# Patient Record
Sex: Female | Born: 1970 | Race: White | Hispanic: No | Marital: Married | Smoking: Former smoker
Health system: Southern US, Community
[De-identification: ages and names within clinical notes are randomized; demographics above are authoritative.]

## PROBLEM LIST (undated history)

## (undated) DIAGNOSIS — G43909 Migraine, unspecified, not intractable, without status migrainosus: Secondary | ICD-10-CM

## (undated) DIAGNOSIS — I8392 Asymptomatic varicose veins of left lower extremity: Secondary | ICD-10-CM

## (undated) HISTORY — DX: Asymptomatic varicose veins of left lower extremity: I83.92

## (undated) HISTORY — DX: Migraine, unspecified, not intractable, without status migrainosus: G43.909

---

## 1999-01-04 ENCOUNTER — Other Ambulatory Visit: Admission: RE | Admit: 1999-01-04 | Discharge: 1999-01-04 | Payer: Self-pay | Admitting: Gynecology

## 1999-06-26 ENCOUNTER — Other Ambulatory Visit: Admission: RE | Admit: 1999-06-26 | Discharge: 1999-06-26 | Payer: Self-pay | Admitting: Obstetrics and Gynecology

## 2000-01-10 ENCOUNTER — Other Ambulatory Visit: Admission: RE | Admit: 2000-01-10 | Discharge: 2000-01-10 | Payer: Self-pay | Admitting: Gynecology

## 2001-07-02 ENCOUNTER — Ambulatory Visit (HOSPITAL_COMMUNITY): Admission: RE | Admit: 2001-07-02 | Discharge: 2001-07-02 | Payer: Self-pay | Admitting: Gynecology

## 2001-11-21 ENCOUNTER — Ambulatory Visit (HOSPITAL_COMMUNITY): Admission: AD | Admit: 2001-11-21 | Discharge: 2001-11-21 | Payer: Self-pay | Admitting: Gynecology

## 2002-02-03 ENCOUNTER — Other Ambulatory Visit: Admission: RE | Admit: 2002-02-03 | Discharge: 2002-02-03 | Payer: Self-pay | Admitting: Gynecology

## 2002-09-22 ENCOUNTER — Ambulatory Visit (HOSPITAL_COMMUNITY): Admission: RE | Admit: 2002-09-22 | Discharge: 2002-09-22 | Payer: Self-pay | Admitting: Obstetrics and Gynecology

## 2002-12-13 ENCOUNTER — Inpatient Hospital Stay (HOSPITAL_COMMUNITY): Admission: AD | Admit: 2002-12-13 | Discharge: 2002-12-16 | Payer: Self-pay | Admitting: Obstetrics and Gynecology

## 2003-01-25 ENCOUNTER — Other Ambulatory Visit: Admission: RE | Admit: 2003-01-25 | Discharge: 2003-01-25 | Payer: Self-pay | Admitting: Obstetrics and Gynecology

## 2004-01-30 ENCOUNTER — Other Ambulatory Visit: Admission: RE | Admit: 2004-01-30 | Discharge: 2004-01-30 | Payer: Self-pay | Admitting: Gynecology

## 2004-10-11 ENCOUNTER — Other Ambulatory Visit: Admission: RE | Admit: 2004-10-11 | Discharge: 2004-10-11 | Payer: Self-pay | Admitting: Obstetrics and Gynecology

## 2005-02-05 ENCOUNTER — Ambulatory Visit (HOSPITAL_COMMUNITY): Admission: RE | Admit: 2005-02-05 | Discharge: 2005-02-05 | Payer: Self-pay | Admitting: Obstetrics and Gynecology

## 2005-04-23 ENCOUNTER — Inpatient Hospital Stay (HOSPITAL_COMMUNITY): Admission: RE | Admit: 2005-04-23 | Discharge: 2005-04-26 | Payer: Self-pay | Admitting: Obstetrics and Gynecology

## 2005-06-18 ENCOUNTER — Other Ambulatory Visit: Admission: RE | Admit: 2005-06-18 | Discharge: 2005-06-18 | Payer: Self-pay | Admitting: Obstetrics and Gynecology

## 2012-01-06 ENCOUNTER — Other Ambulatory Visit: Payer: Self-pay | Admitting: Obstetrics and Gynecology

## 2013-01-06 ENCOUNTER — Other Ambulatory Visit: Payer: Self-pay | Admitting: Obstetrics and Gynecology

## 2014-01-20 ENCOUNTER — Other Ambulatory Visit: Payer: Self-pay | Admitting: Obstetrics and Gynecology

## 2015-01-23 ENCOUNTER — Other Ambulatory Visit: Payer: Self-pay | Admitting: Obstetrics and Gynecology

## 2015-01-24 LAB — CYTOLOGY - PAP

## 2016-06-14 DIAGNOSIS — Z23 Encounter for immunization: Secondary | ICD-10-CM | POA: Diagnosis not present

## 2016-12-31 DIAGNOSIS — S86811A Strain of other muscle(s) and tendon(s) at lower leg level, right leg, initial encounter: Secondary | ICD-10-CM | POA: Diagnosis not present

## 2017-01-14 DIAGNOSIS — S86811D Strain of other muscle(s) and tendon(s) at lower leg level, right leg, subsequent encounter: Secondary | ICD-10-CM | POA: Diagnosis not present

## 2017-01-20 DIAGNOSIS — R262 Difficulty in walking, not elsewhere classified: Secondary | ICD-10-CM | POA: Diagnosis not present

## 2017-01-20 DIAGNOSIS — S96011D Strain of muscle and tendon of long flexor muscle of toe at ankle and foot level, right foot, subsequent encounter: Secondary | ICD-10-CM | POA: Diagnosis not present

## 2017-04-10 DIAGNOSIS — Z01419 Encounter for gynecological examination (general) (routine) without abnormal findings: Secondary | ICD-10-CM | POA: Diagnosis not present

## 2017-04-10 DIAGNOSIS — Z1212 Encounter for screening for malignant neoplasm of rectum: Secondary | ICD-10-CM | POA: Diagnosis not present

## 2017-04-14 DIAGNOSIS — Z1231 Encounter for screening mammogram for malignant neoplasm of breast: Secondary | ICD-10-CM | POA: Diagnosis not present

## 2017-07-13 DIAGNOSIS — Z23 Encounter for immunization: Secondary | ICD-10-CM | POA: Diagnosis not present

## 2018-04-14 DIAGNOSIS — Z1329 Encounter for screening for other suspected endocrine disorder: Secondary | ICD-10-CM | POA: Diagnosis not present

## 2018-04-14 DIAGNOSIS — Z0000 Encounter for general adult medical examination without abnormal findings: Secondary | ICD-10-CM | POA: Diagnosis not present

## 2018-04-14 DIAGNOSIS — Z1159 Encounter for screening for other viral diseases: Secondary | ICD-10-CM | POA: Diagnosis not present

## 2018-04-14 DIAGNOSIS — Z13220 Encounter for screening for lipoid disorders: Secondary | ICD-10-CM | POA: Diagnosis not present

## 2018-04-14 DIAGNOSIS — Z114 Encounter for screening for human immunodeficiency virus [HIV]: Secondary | ICD-10-CM | POA: Diagnosis not present

## 2018-04-15 DIAGNOSIS — Z6823 Body mass index (BMI) 23.0-23.9, adult: Secondary | ICD-10-CM | POA: Diagnosis not present

## 2018-04-15 DIAGNOSIS — Z1231 Encounter for screening mammogram for malignant neoplasm of breast: Secondary | ICD-10-CM | POA: Diagnosis not present

## 2018-04-15 DIAGNOSIS — Z01419 Encounter for gynecological examination (general) (routine) without abnormal findings: Secondary | ICD-10-CM | POA: Diagnosis not present

## 2018-04-16 ENCOUNTER — Other Ambulatory Visit: Payer: Self-pay | Admitting: Family Medicine

## 2018-04-16 DIAGNOSIS — I8392 Asymptomatic varicose veins of left lower extremity: Secondary | ICD-10-CM | POA: Diagnosis not present

## 2018-04-16 DIAGNOSIS — Z0000 Encounter for general adult medical examination without abnormal findings: Secondary | ICD-10-CM | POA: Diagnosis not present

## 2018-04-16 DIAGNOSIS — Z6823 Body mass index (BMI) 23.0-23.9, adult: Secondary | ICD-10-CM | POA: Diagnosis not present

## 2018-04-16 DIAGNOSIS — M7989 Other specified soft tissue disorders: Secondary | ICD-10-CM | POA: Diagnosis not present

## 2018-04-16 DIAGNOSIS — G43909 Migraine, unspecified, not intractable, without status migrainosus: Secondary | ICD-10-CM | POA: Diagnosis not present

## 2018-04-16 DIAGNOSIS — Z3041 Encounter for surveillance of contraceptive pills: Secondary | ICD-10-CM | POA: Diagnosis not present

## 2018-04-16 DIAGNOSIS — Z23 Encounter for immunization: Secondary | ICD-10-CM | POA: Diagnosis not present

## 2018-04-17 ENCOUNTER — Encounter: Payer: Self-pay | Admitting: Internal Medicine

## 2018-05-18 DIAGNOSIS — G43909 Migraine, unspecified, not intractable, without status migrainosus: Secondary | ICD-10-CM | POA: Diagnosis not present

## 2018-05-18 DIAGNOSIS — I8392 Asymptomatic varicose veins of left lower extremity: Secondary | ICD-10-CM | POA: Diagnosis not present

## 2018-05-18 DIAGNOSIS — Z6822 Body mass index (BMI) 22.0-22.9, adult: Secondary | ICD-10-CM | POA: Diagnosis not present

## 2018-05-18 DIAGNOSIS — Z23 Encounter for immunization: Secondary | ICD-10-CM | POA: Diagnosis not present

## 2018-05-18 DIAGNOSIS — R2242 Localized swelling, mass and lump, left lower limb: Secondary | ICD-10-CM | POA: Diagnosis not present

## 2018-06-16 ENCOUNTER — Ambulatory Visit: Payer: Self-pay | Admitting: Internal Medicine

## 2018-07-23 ENCOUNTER — Encounter: Payer: Self-pay | Admitting: Sports Medicine

## 2018-07-23 ENCOUNTER — Ambulatory Visit: Payer: BLUE CROSS/BLUE SHIELD | Admitting: Sports Medicine

## 2018-07-23 VITALS — BP 126/88 | Ht 68.0 in | Wt 150.0 lb

## 2018-07-23 DIAGNOSIS — M7551 Bursitis of right shoulder: Secondary | ICD-10-CM | POA: Diagnosis not present

## 2018-07-23 MED ORDER — METHYLPREDNISOLONE ACETATE 40 MG/ML IJ SUSP
40.0000 mg | Freq: Once | INTRAMUSCULAR | Status: AC
  Administered 2018-07-23: 40 mg via INTRA_ARTICULAR

## 2018-07-23 NOTE — Progress Notes (Signed)
PCP: Lewis Moccasin, MD  Subjective:   HPI: Patient is a 47 y.o. female here for right lateral shoulder pain.  Patient is an active tennis player who has had shoulder pain for the past 4-6 weeks. No inciting injury or event. She reports gradual worsening of pain- eaching overhead for serve and going back for a backhand are aggravating motions. Recently, pain is worse at night when she lies on her right shoulder. She takes 600 mg ibuprofen before bed. Has not been icing and continues to play tennis 3x weekly but is approaching a time when she can take a break.  She denies any significant weakness, numbness or tingling.  No associated skin changes  Past Medical History:  Diagnosis Date  . Migraines     No current outpatient medications on file prior to visit.   No current facility-administered medications on file prior to visit.     History reviewed. No pertinent surgical history.  No Known Allergies  Social History   Socioeconomic History  . Marital status: Married    Spouse name: Not on file  . Number of children: Not on file  . Years of education: Not on file  . Highest education level: Not on file  Occupational History  . Not on file  Social Needs  . Financial resource strain: Not on file  . Food insecurity:    Worry: Not on file    Inability: Not on file  . Transportation needs:    Medical: Not on file    Non-medical: Not on file  Tobacco Use  . Smoking status: Not on file  Substance and Sexual Activity  . Alcohol use: Not on file  . Drug use: Not on file  . Sexual activity: Not on file  Lifestyle  . Physical activity:    Days per week: Not on file    Minutes per session: Not on file  . Stress: Not on file  Relationships  . Social connections:    Talks on phone: Not on file    Gets together: Not on file    Attends religious service: Not on file    Active member of club or organization: Not on file    Attends meetings of clubs or organizations: Not on  file    Relationship status: Not on file  . Intimate partner violence:    Fear of current or ex partner: Not on file    Emotionally abused: Not on file    Physically abused: Not on file    Forced sexual activity: Not on file  Other Topics Concern  . Not on file  Social History Narrative  . Not on file    History reviewed. No pertinent family history.  BP 126/88   Ht 5\' 8"  (1.727 m)   Wt 150 lb (68 kg)   BMI 22.81 kg/m   Review of Systems: See HPI above.     Objective:  Physical Exam:  Gen: NAD, comfortable in exam room  Shoulder: No obvious deformity or asymmetry. No bruising. No swelling No TTP. Specifically no TTP over AC joint Full ROM in flexion, abduction, internal/external rotation NV intact distally Special Tests:  - Impingement: Positive Hawkins and Neers.  - Supraspinatous: Pain with empty can.  5/5 strength - Infraspinatous/Teres: 5/5 strength with ER - Subscapularis: negative belly press, negative bear hug. 5/5 strength with IR - Biceps tendon: Negative Speeds.  - Labrum: Negative Obriens.  - AC Joint: Negative cross arm - Negative apprehension test  ULTRASOUND: Shoulder, right Diagnostic limited ultrasound imaging obtained of patient's right shoulder.  - No obvious evidence of bony deformity or osteophyte development appreciated.  - Long head of the biceps tendon: No evidence of tendon thickening, calcification, subluxation, or tearing in short or long axis views. No edema  - Subscapularis tendon: complete visualization across the width of the insertion point yielded no evidence of tendon thickening, calcification, or tears in the long axis view.  - Supraspinatus tendon: complete visualization across the width of the insertion point yielded no evidence of tendon thickening, calcification, or tears in the long axis view.  Evidence of bursal inflammation appreciated with hypoechoic changes   - Infraspinatus and teres minor tendons: visualization across  the width of the insertion points yielded n evidence of tendon thickening, calcification, or tears in the long axis view.  [facility] Joint: No evidence of joint separation, collapse, or osteophyte development appreciated. No effusion present.  IMPRESSION: findings consistent with right subacromial bursitis   Assessment & Plan:  1. Right subacromial bursitis- evidence on exam and ultrasound - steroid injection into subacromial space as detailed below - instructed patient to take NSAIDs for pain, ice daily - provided home exercises - activity as tolerated - follow up in 6 weeks   Procedure performed: subacromial corticosteroid injection; palpation guided  Consent obtained and verified. Time-out conducted. Noted no overlying erythema, induration, or other signs of local infection. The Right posterior subacromial space was palpated and marked. The overlying skin was prepped in a sterile fashion. Topical analgesic spray: Ethyl chloride. Joint: right subacromial Needle: 25ga, 1.5" Completed without difficulty. Meds: 40 mg Depo-Medrol, 4 cc lidocaine  Advised to call if fevers/chills, erythema, induration, drainage, or persistent bleeding.   Above note written by Dr. Ezzard Standing.  Pt was independently evaluated/examined by me. Agree with evaluation and plan as above.  Patient will follow-up in 6 weeks - Dalbert Garnet, DO Sports Medicine Fellow  I was available as preceptor for this visit. Reino Bellis, DO

## 2018-07-23 NOTE — Patient Instructions (Signed)
Your shoulder pain is due to subacromial bursitis. Today you received a steroid injection into the subacromial space. You may continue to use ibuprofen 600-800 mg up to 3 times daily as needed for pain or Aleve 2 tablets twice daily as needed for pain. Ice 15 minutes 3-4 times daily Perform the home exercises you were instructed on today once daily about 5 days/week You may continue activity as tolerated.  Let pain be your guide. We will have you follow-up in about 6 weeks or sooner if pain significantly worsens.

## 2018-08-25 ENCOUNTER — Other Ambulatory Visit: Payer: Self-pay

## 2018-08-25 DIAGNOSIS — R609 Edema, unspecified: Secondary | ICD-10-CM

## 2018-09-03 ENCOUNTER — Encounter: Payer: Self-pay | Admitting: Sports Medicine

## 2018-09-03 ENCOUNTER — Ambulatory Visit (INDEPENDENT_AMBULATORY_CARE_PROVIDER_SITE_OTHER): Payer: BLUE CROSS/BLUE SHIELD | Admitting: Sports Medicine

## 2018-09-03 VITALS — BP 128/90 | Ht 68.0 in | Wt 153.0 lb

## 2018-09-03 DIAGNOSIS — M25511 Pain in right shoulder: Secondary | ICD-10-CM

## 2018-09-03 MED ORDER — NITROGLYCERIN 0.2 MG/HR TD PT24: MEDICATED_PATCH | TRANSDERMAL | 1 refills | Status: DC

## 2018-09-03 NOTE — Patient Instructions (Signed)
Nitroglycerin Protocol   Apply 1/4 nitroglycerin patch to affected area daily.  Change position of patch within the affected area every 24 hours.  You may experience a headache during the first 1-2 weeks of using the patch, these should subside.  If you experience headaches after beginning nitroglycerin patch treatment, you may take your preferred over the counter pain reliever.  Another side effect of the nitroglycerin patch is skin irritation or rash related to patch adhesive.  Please notify our office if you develop more severe headaches or rash, and stop the patch.  Tendon healing with nitroglycerin patch may require 12 to 24 weeks depending on the extent of injury.  Men should not use if taking Viagra, Cialis, or Levitra.   Do not use if you have migraines or rosacea.    Follow up in 6 weeks. 

## 2018-09-03 NOTE — Progress Notes (Addendum)
  Samantha FreezeMargaret S Dunn - 47 y.o. female MRN [id number]  Date of birth: 1970-10-10    SUBJECTIVE:      Chief Complaint:/ HPI:   Samantha EagleMargaret Dunn is a 47 year old female who presents to clinic for a follow-up of worsening R shoulder pain.  She was seen in clinic 07/23/18 for the same and given a corticosteroid injection to the R shoulder which did not provide relief.  She has continued to have pain with activities requiring her to lift her R arm, most notably serving and overhead movements while playing tennis.  The pain is constant and she rates it 8/10 in severity at its worst.  She denies radiation of pain down her arm, neck pain, numbness/tingling or weakness.  She has been treating it with Tylenol and Aleve with mild relief.  Samantha Dunn reports she has been performing recommended at-home physical therapy exercises.   ROS:     See HPI  PERTINENT  PMH / PSH FH / / SH:  Past Medical, Surgical, Social, and Family History Reviewed & Updated in the EMR.  Pertinent findings include:   Migraines  OBJECTIVE: BP 128/90   Ht 5\' 8"  (1.727 m)   Wt 153 lb (69.4 kg)   BMI 23.26 kg/m   Physical Exam:  Vital signs are reviewed.  GEN: Alert and oriented, NAD Pulm: Breathing unlabored PSY: normal mood, congruent affect  MSK:  R shoulder Inspection: No swelling or erythema. No rashes. Palpation: No TTP Range of Motion: active ROM limited by pain.  Full ROM with passive movement. Strength: 4+ strength to flexion, internal rotation, limited by pain Stability: Special Tests: Positive Hawkins and Neers.  Pain with empty can and lift off.   Neurovascular Status: Sensation intact.  Bicep and brachioradialis reflexes 2+.    ASSESSMENT & PLAN:  1. Rotator Cuff Tendinopathy Patient's history of pain with overhead movement and physical exam findings support rotator cuff tendinopathy.  Will refer to physical therapy.  Nitro patch prescribed for pain relief and discussed side effects.   Follow-up in 6 weeks.  If not improved, will consider further imaging and additional course of corticosteroid injection.  I was the preceptor for this visit and available for immediate consultation. Marsa Arisimothy Ryan Draper, DO

## 2018-09-07 ENCOUNTER — Encounter: Payer: Self-pay | Admitting: Surgery

## 2018-09-07 ENCOUNTER — Other Ambulatory Visit: Payer: Self-pay

## 2018-09-07 ENCOUNTER — Ambulatory Visit (INDEPENDENT_AMBULATORY_CARE_PROVIDER_SITE_OTHER): Payer: BLUE CROSS/BLUE SHIELD | Admitting: Surgery

## 2018-09-07 ENCOUNTER — Ambulatory Visit (HOSPITAL_COMMUNITY)
Admission: RE | Admit: 2018-09-07 | Discharge: 2018-09-07 | Disposition: A | Discharge status: Home / Self Care | Payer: BLUE CROSS/BLUE SHIELD | Source: Ambulatory Visit | Attending: Family Medicine | Admitting: Family Medicine

## 2018-09-07 VITALS — BP 144/94 | HR 72 | Temp 97.0°F | Resp 16 | Ht 68.0 in | Wt 153.0 lb

## 2018-09-07 DIAGNOSIS — R609 Edema, unspecified: Secondary | ICD-10-CM | POA: Insufficient documentation

## 2018-09-07 DIAGNOSIS — I83893 Varicose veins of bilateral lower extremities with other complications: Secondary | ICD-10-CM | POA: Diagnosis not present

## 2018-09-07 NOTE — Progress Notes (Signed)
[facility]  Patient name: Samantha Dunn MRN: [id number] DOB: [birth year] Sex: female   REQUESTING PROVIDER:    Dr. Duanne Guess   REASON FOR CONSULT:    Varicose veins  HISTORY OF PRESENT ILLNESS:   Samantha Dunn is a 47 y.o. female, who is referred for evaluation of varicose veins.  She states that she has been having swelling in her legs for approximately 10 months.  She has had her varicose veins for longer than that.  Her swelling is worse with prolonged standing.  She was on a birth control pill over the summer which has been discontinued.  She has not worn compression stockings.  She denies a history of DVT or thrombophlebitis.  PAST MEDICAL HISTORY    Past Medical History:  Diagnosis Date  . Migraines      FAMILY HISTORY   History reviewed. No pertinent family history.  SOCIAL HISTORY:   Social History   Socioeconomic History  . Marital status: Married    Spouse name: Not on file  . Number of children: Not on file  . Years of education: Not on file  . Highest education level: Not on file  Occupational History  . Not on file  Social Needs  . Financial resource strain: Not on file  . Food insecurity:    Worry: Not on file    Inability: Not on file  . Transportation needs:    Medical: Not on file    Non-medical: Not on file  Tobacco Use  . Smoking status: Former Smoker    Types: Cigarettes  . Smokeless tobacco: Never Used  Substance and Sexual Activity  . Alcohol use: Yes  . Drug use: Never  . Sexual activity: Not on file  Lifestyle  . Physical activity:    Days per week: Not on file    Minutes per session: Not on file  . Stress: Not on file  Relationships  . Social connections:    Talks on phone: Not on file    Gets together: Not on file    Attends religious service: Not on file    Active member of club or organization: Not on file    Attends meetings of clubs or  organizations: Not on file    Relationship status: Not on file  . Intimate partner violence:    Fear of current or ex partner: Not on file    Emotionally abused: Not on file    Physically abused: Not on file    Forced sexual activity: Not on file  Other Topics Concern  . Not on file  Social History Narrative  . Not on file    ALLERGIES:    No Known Allergies  CURRENT MEDICATIONS:    Current Outpatient Medications  Medication Sig Dispense Refill  . nitroGLYCERIN (NITRODUR - DOSED IN MG/24 HR) 0.2 mg/hr patch Use 1/4 patch daily to the affected area. 30 patch 1   No current facility-administered medications for this visit.     REVIEW OF SYSTEMS:   [X]  denotes positive finding, [ ]  denotes negative finding Cardiac  Comments:  Chest pain or chest pressure:    Shortness of breath upon exertion:    Short of breath when lying flat:    Irregular heart rhythm:        Vascular    Pain in calf, thigh, or hip brought on by ambulation:    Pain in feet at night that wakes you up from your sleep:  Blood clot in your veins:    Leg swelling:  x       Pulmonary    Oxygen at home:    Productive cough:     Wheezing:         Neurologic    Sudden weakness in arms or legs:     Sudden numbness in arms or legs:     Sudden onset of difficulty speaking or slurred speech:    Temporary loss of vision in one eye:     Problems with dizziness:         Gastrointestinal    Blood in stool:      Vomited blood:         Genitourinary    Burning when urinating:     Blood in urine:        Psychiatric    Major depression:         Hematologic    Bleeding problems:    Problems with blood clotting too easily:        Skin    Rashes or ulcers:        Constitutional    Fever or chills:     PHYSICAL EXAM:   Vitals:   09/07/18 1428  BP: (!) 144/94  Pulse: 72  Resp: 16  Temp: (!) 97 F (36.1 C)  TempSrc: Oral  SpO2: 98%  Weight: 153 lb (69.4 kg)  Height: 5\' 8"  (1.727 m)     GENERAL: The patient is a well-nourished female, in no acute distress. The vital signs are documented above. VASCULAR: Palpable pedal pulses bilaterally.  Trace edema bilaterally.  Prominent posterior left calf varicosities PULMONARY: Nonlabored respirations MUSCULOSKELETAL: There are no major deformities or cyanosis. NEUROLOGIC: No focal weakness or paresthesias are detected. SKIN: There are no ulcers or rashes noted. PSYCHIATRIC: The patient has a normal affect.  STUDIES:   I have ordered and reviewed her vascular studies with the following findings: Venous Reflux Times Normal value < 0.5 sec +------------------------------+----------+---------+                Right (ms)Left (ms) +------------------------------+----------+---------+ CFV                   2428.00  +------------------------------+----------+---------+ GSV at Saphenofemoral junction     2230.00  +------------------------------+----------+---------+ GSV prox thigh             579.00   +------------------------------+----------+---------+ SSV origin               4225.00  +------------------------------+----------+---------+ SSV prox                4856.00  +------------------------------+----------+---------+ SSV mid                 4005.00  +------------------------------+----------+---------+  Vein Diameters: +------------------------------+----------+---------+                Right (cm)Left (cm) +------------------------------+----------+---------+ GSV at Saphenofemoral junction     0.649   +------------------------------+----------+---------+ GSV at prox thigh            0.331   +------------------------------+----------+---------+ GSV at mid thigh            0.304    +------------------------------+----------+---------+ GSV at distal thigh           0.330   +------------------------------+----------+---------+ GSV at knee               0.284   +------------------------------+----------+---------+ GSV prox calf  0.280   +------------------------------+----------+---------+ GSV mid calf              0.346   +------------------------------+----------+---------+ SSV origin               0.910   +------------------------------+----------+---------+ SSV prox                0.839   +------------------------------+----------+---------+ SSV mid                 0.702   +------------------------------+----------+---------+     Summary: Left: Abnormal reflux times were noted in the common femoral vein, great saphenous vein at the saphenofemoral junction, great saphenous vein at the proximal thigh, origin of the small saphenous vein, prox small saphenous vein, and mid small saphenous  vein. There is no evidence of deep vein thrombosis in the lower extremity within the CFV, FV, and POPV.There is no evidence of superficial venous thrombosis within the GSV and SSV.  ASSESSMENT and PLAN   Varicose veins with edema, left leg (CEAP class III): We discussed the pathophysiology of venous reflux.  We will treat her initially non-operatively.  This will consist of 20-30 thigh-high compression stockings, leg elevation and ibuprofen for pain control.  Patient will be brought back in 3 months for follow-up evaluation to discuss the possibility of endovenous laser ablation and stab phlebectomy.  She has significant small saphenous reflux and moderate great saphenous reflux.   Durene Cal, MD [facility] [phone] Pager [phone]

## 2018-09-11 DIAGNOSIS — R293 Abnormal posture: Secondary | ICD-10-CM | POA: Diagnosis not present

## 2018-09-11 DIAGNOSIS — M25511 Pain in right shoulder: Secondary | ICD-10-CM | POA: Diagnosis not present

## 2018-09-11 DIAGNOSIS — M629 Disorder of muscle, unspecified: Secondary | ICD-10-CM | POA: Diagnosis not present

## 2018-09-14 DIAGNOSIS — M629 Disorder of muscle, unspecified: Secondary | ICD-10-CM | POA: Diagnosis not present

## 2018-09-14 DIAGNOSIS — R293 Abnormal posture: Secondary | ICD-10-CM | POA: Diagnosis not present

## 2018-09-14 DIAGNOSIS — M25511 Pain in right shoulder: Secondary | ICD-10-CM | POA: Diagnosis not present

## 2018-09-18 DIAGNOSIS — M629 Disorder of muscle, unspecified: Secondary | ICD-10-CM | POA: Diagnosis not present

## 2018-09-18 DIAGNOSIS — M25511 Pain in right shoulder: Secondary | ICD-10-CM | POA: Diagnosis not present

## 2018-09-18 DIAGNOSIS — R293 Abnormal posture: Secondary | ICD-10-CM | POA: Diagnosis not present

## 2018-09-22 DIAGNOSIS — M25511 Pain in right shoulder: Secondary | ICD-10-CM | POA: Diagnosis not present

## 2018-09-22 DIAGNOSIS — M629 Disorder of muscle, unspecified: Secondary | ICD-10-CM | POA: Diagnosis not present

## 2018-09-22 DIAGNOSIS — R293 Abnormal posture: Secondary | ICD-10-CM | POA: Diagnosis not present

## 2018-09-25 DIAGNOSIS — M25511 Pain in right shoulder: Secondary | ICD-10-CM | POA: Diagnosis not present

## 2018-09-25 DIAGNOSIS — R293 Abnormal posture: Secondary | ICD-10-CM | POA: Diagnosis not present

## 2018-09-25 DIAGNOSIS — M629 Disorder of muscle, unspecified: Secondary | ICD-10-CM | POA: Diagnosis not present

## 2018-09-28 ENCOUNTER — Telehealth: Payer: Self-pay | Admitting: Sports Medicine

## 2018-09-28 NOTE — Telephone Encounter (Signed)
Carly from [facility] on [address] inquiring about prescription for 'Iontophoresis'.  Please call and let her know the status of this script for this patient.  [phone])

## 2018-09-28 NOTE — Telephone Encounter (Signed)
Will fax PT order for Iontophoresis to Carlie at [phone]

## 2018-10-02 ENCOUNTER — Other Ambulatory Visit: Payer: Self-pay

## 2018-10-02 DIAGNOSIS — M629 Disorder of muscle, unspecified: Secondary | ICD-10-CM | POA: Diagnosis not present

## 2018-10-02 DIAGNOSIS — R293 Abnormal posture: Secondary | ICD-10-CM | POA: Diagnosis not present

## 2018-10-02 DIAGNOSIS — M25511 Pain in right shoulder: Secondary | ICD-10-CM | POA: Diagnosis not present

## 2018-10-02 MED ORDER — AMBULATORY NON FORMULARY MEDICATION: 0 refills | Status: DC

## 2018-10-06 DIAGNOSIS — R293 Abnormal posture: Secondary | ICD-10-CM | POA: Diagnosis not present

## 2018-10-06 DIAGNOSIS — M629 Disorder of muscle, unspecified: Secondary | ICD-10-CM | POA: Diagnosis not present

## 2018-10-06 DIAGNOSIS — M25511 Pain in right shoulder: Secondary | ICD-10-CM | POA: Diagnosis not present

## 2018-10-09 DIAGNOSIS — M629 Disorder of muscle, unspecified: Secondary | ICD-10-CM | POA: Diagnosis not present

## 2018-10-09 DIAGNOSIS — R293 Abnormal posture: Secondary | ICD-10-CM | POA: Diagnosis not present

## 2018-10-09 DIAGNOSIS — M25511 Pain in right shoulder: Secondary | ICD-10-CM | POA: Diagnosis not present

## 2018-10-13 DIAGNOSIS — M629 Disorder of muscle, unspecified: Secondary | ICD-10-CM | POA: Diagnosis not present

## 2018-10-13 DIAGNOSIS — M25511 Pain in right shoulder: Secondary | ICD-10-CM | POA: Diagnosis not present

## 2018-10-13 DIAGNOSIS — R293 Abnormal posture: Secondary | ICD-10-CM | POA: Diagnosis not present

## 2018-10-15 ENCOUNTER — Ambulatory Visit: Payer: BLUE CROSS/BLUE SHIELD | Admitting: Sports Medicine

## 2018-10-16 DIAGNOSIS — R293 Abnormal posture: Secondary | ICD-10-CM | POA: Diagnosis not present

## 2018-10-16 DIAGNOSIS — M25511 Pain in right shoulder: Secondary | ICD-10-CM | POA: Diagnosis not present

## 2018-10-16 DIAGNOSIS — M629 Disorder of muscle, unspecified: Secondary | ICD-10-CM | POA: Diagnosis not present

## 2018-10-19 DIAGNOSIS — M25511 Pain in right shoulder: Secondary | ICD-10-CM | POA: Diagnosis not present

## 2018-10-19 DIAGNOSIS — R293 Abnormal posture: Secondary | ICD-10-CM | POA: Diagnosis not present

## 2018-10-19 DIAGNOSIS — M629 Disorder of muscle, unspecified: Secondary | ICD-10-CM | POA: Diagnosis not present

## 2018-10-20 DIAGNOSIS — Z23 Encounter for immunization: Secondary | ICD-10-CM | POA: Diagnosis not present

## 2018-10-26 DIAGNOSIS — M629 Disorder of muscle, unspecified: Secondary | ICD-10-CM | POA: Diagnosis not present

## 2018-10-26 DIAGNOSIS — R293 Abnormal posture: Secondary | ICD-10-CM | POA: Diagnosis not present

## 2018-10-26 DIAGNOSIS — M25511 Pain in right shoulder: Secondary | ICD-10-CM | POA: Diagnosis not present

## 2018-11-02 DIAGNOSIS — M629 Disorder of muscle, unspecified: Secondary | ICD-10-CM | POA: Diagnosis not present

## 2018-11-02 DIAGNOSIS — R293 Abnormal posture: Secondary | ICD-10-CM | POA: Diagnosis not present

## 2018-11-02 DIAGNOSIS — M25511 Pain in right shoulder: Secondary | ICD-10-CM | POA: Diagnosis not present

## 2018-12-10 ENCOUNTER — Ambulatory Visit: Payer: BLUE CROSS/BLUE SHIELD | Admitting: Vascular Surgery

## 2018-12-24 ENCOUNTER — Ambulatory Visit: Payer: BLUE CROSS/BLUE SHIELD | Admitting: Vascular Surgery

## 2019-03-15 DIAGNOSIS — H6691 Otitis media, unspecified, right ear: Secondary | ICD-10-CM | POA: Diagnosis not present

## 2019-03-31 ENCOUNTER — Telehealth (HOSPITAL_COMMUNITY): Payer: Self-pay | Admitting: Rehabilitation

## 2019-03-31 DIAGNOSIS — R448 Other symptoms and signs involving general sensations and perceptions: Secondary | ICD-10-CM | POA: Diagnosis not present

## 2019-03-31 DIAGNOSIS — H6691 Otitis media, unspecified, right ear: Secondary | ICD-10-CM | POA: Diagnosis not present

## 2019-03-31 NOTE — Telephone Encounter (Signed)
The above patient or their representative was contacted and gave the following answers to these questions:         Do you have any of the following symptoms? No Fever                    Cough                   Shortness of breath  Do  you have any of the following other symptoms? No  muscle pain         vomiting,        diarrhea        rash         weakness        red eye        abdominal pain         bruising          bruising or bleeding              joint pain           severe headache   Have you been in contact with someone who was or has been sick in the past 2 weeks? No Yes                 Unsure                         Unable to assess   Does the person that you were in contact with have any of the following symptoms?  Cough         shortness of breath           muscle pain         vomiting,            diarrhea            rash            weakness           fever            red eye           abdominal pain          bruising  or  bleeding                joint pain                severe headache             Have you  or someone you have been in contact with traveled internationally in the last month?  No      If yes, which countries?  Have you  or someone you have been in contact with traveled outside [place] in the last month?  Yes     If yes, which state and city? [place], [place]  COMMENTS OR ACTION PLAN FOR THIS PATIENT:

## 2019-04-01 ENCOUNTER — Ambulatory Visit (INDEPENDENT_AMBULATORY_CARE_PROVIDER_SITE_OTHER): Payer: BC Managed Care – PPO | Admitting: Vascular Surgery

## 2019-04-01 ENCOUNTER — Other Ambulatory Visit: Payer: Self-pay

## 2019-04-01 ENCOUNTER — Encounter: Payer: Self-pay | Admitting: Vascular Surgery

## 2019-04-01 VITALS — BP 125/81 | HR 64 | Temp 98.7°F | Resp 16

## 2019-04-01 DIAGNOSIS — Z1159 Encounter for screening for other viral diseases: Secondary | ICD-10-CM | POA: Diagnosis not present

## 2019-04-01 DIAGNOSIS — I83893 Varicose veins of bilateral lower extremities with other complications: Secondary | ICD-10-CM

## 2019-04-01 DIAGNOSIS — R609 Edema, unspecified: Secondary | ICD-10-CM | POA: Diagnosis not present

## 2019-04-01 NOTE — Progress Notes (Signed)
Patient name: Samantha HawMargaret S Orwick MRN: [id number] DOB: 11/14/1970 Sex: female  REASON FOR VISIT:   Follow-up of painful varicose veins  HPI:   Samantha Dunn is a pleasant 48 y.o. female who was seen by Dr. Durene CalWells Brabham on 09/07/2018 with painful varicose veins.  She also complained of leg swelling at the time.  At that time she was noted to have some deep venous reflux and also reflux at the saphenofemoral junction.  Most impressively the small saphenous vein had marketed reflux and was markedly dilated.  This was all on the left leg.   On my history the patient continues to have significant heaviness and aching pain in her left leg associated with standing and relieved with elevation.  She has been wearing her thigh-high compression stockings with some relief but continues to have significant symptoms.  She takes ibuprofen as needed for pain.  Her symptoms have not improved.  Past Medical History:  Diagnosis Date  . Migraines   . Varicose veins of left lower extremity     No family history on file.  SOCIAL HISTORY: Social History   Tobacco Use  . Smoking status: Former Smoker    Types: Cigarettes  . Smokeless tobacco: Never Used  Substance Use Topics  . Alcohol use: Yes    No Known Allergies  Current Outpatient Medications  Medication Sig Dispense Refill  . AMBULATORY NON FORMULARY MEDICATION Dexamethasone Liquid Solution 0.4% (Patient not taking: Reported on 04/01/2019) 2 oz 0  . amoxicillin-clavulanate (AUGMENTIN) 875-125 MG tablet TAKE 1 TABLET BY MOUTH EVERY 12 HOURS FOR 10 DAYS    . nitroGLYCERIN (NITRODUR - DOSED IN MG/24 HR) 0.2 mg/hr patch Use 1/4 patch daily to the affected area. (Patient not taking: Reported on 04/01/2019) 30 patch 1   No current facility-administered medications for this visit.     REVIEW OF SYSTEMS:  [X]  denotes positive finding, [ ]  denotes negative finding Cardiac  Comments:  Chest pain or chest pressure:    Shortness of breath  upon exertion:    Short of breath when lying flat:    Irregular heart rhythm:        Vascular    Pain in calf, thigh, or hip brought on by ambulation:    Pain in feet at night that wakes you up from your sleep:     Blood clot in your veins:    Leg swelling:         Pulmonary    Oxygen at home:    Productive cough:     Wheezing:         Neurologic    Sudden weakness in arms or legs:     Sudden numbness in arms or legs:     Sudden onset of difficulty speaking or slurred speech:    Temporary loss of vision in one eye:     Problems with dizziness:         Gastrointestinal    Blood in stool:     Vomited blood:         Genitourinary    Burning when urinating:     Blood in urine:        Psychiatric    Major depression:         Hematologic    Bleeding problems:    Problems with blood clotting too easily:        Skin    Rashes or ulcers:        Constitutional  Fever or chills:     PHYSICAL EXAM:   Vitals:   04/01/19 1315  BP: 125/81  Pulse: 64  Resp: 16  Temp: 98.7 F (37.1 C)  TempSrc: Temporal  SpO2: 99%    GENERAL: The patient is a well-nourished female, in no acute distress. The vital signs are documented above. CARDIAC: There is a regular rate and rhythm.  VASCULAR: I do not detect carotid bruits.   Both feet are warm and well-perfused. This patient has some varicose veins along the medial left calf and posterior left calf that are under significantly elevated pressure. I did look at the small saphenous vein with the SonoSite and there are significant reflux throughout and the vein is markedly dilated.  We would cannulate the vein and then mid to distal calf. There is no hyperpigmentation.  There is moderate left leg swelling. PULMONARY: There is good air exchange bilaterally without wheezing or rales. ABDOMEN: Soft and non-tender with normal pitched bowel sounds.  MUSCULOSKELETAL: There are no major deformities or cyanosis. NEUROLOGIC: No focal  weakness or paresthesias are detected. SKIN: There are no ulcers or rashes noted. PSYCHIATRIC: The patient has a normal affect.  DATA:    VENOUS DUPLEX: I did review the previous venous duplex scan which showed significant reflux in the small saphenous vein throughout its entire length.  The vein was markedly dilated.  Great saphenous vein had some incompetence in the proximal thigh and at the saphenofemoral junction.  There is deep venous reflux in the common femoral vein.  There is no evidence of DVT.  MEDICAL ISSUES:   PAINFUL VARICOSE VEINS LEFT LOWER EXTREMITY: This patient has painful varicose veins of the left lower extremity and leg swelling.  She has CEAP C3 venous disease.  She is failed conservative treatment.  For this reason I think she would be a good candidate for endovenous laser ablation of the small saphenous vein with 10-20 stab phlebectomies.  I have discussed the indications for endovenous laser ablation of the left SSV, that is to lower the pressure in the veins and potentially help relieve the symptoms from venous hypertension. I have also discussed alternative options including conservative treatment with leg elevation, compression therapy, exercise, avoiding prolonged sitting and standing, and weight management. I have discussed the potential complications of the procedure, including, but not limited to: bleeding, bruising, leg swelling, nerve injury, skin burns, significant pain from phlebitis, deep venous thrombosis, or failure of the vein to close.  I have also explained that venous insufficiency is a chronic disease, and that the patient is at risk for recurrent varicose veins in the future.  All of the patient's questions were encouraged and answered. They are agreeable to proceed.   I have discussed with the patient the indications for stab phlebectomy.  I have explained to the patient that that will have small scars from the stab incisions.  I explained that the other  risks include leg swelling, bruising, bleeding, and phlebitis.  All the patient's questions were encouraged and answered and they are agreeable to proceed.  A total of 60minutes was spent on this visit. 15 minutes was face to face time. More than 50% of the time was spent on counseling and coordinating with the patient.    Deitra Mayo [facility] [phone]

## 2019-04-06 DIAGNOSIS — H6121 Impacted cerumen, right ear: Secondary | ICD-10-CM | POA: Diagnosis not present

## 2019-04-06 DIAGNOSIS — H6091 Unspecified otitis externa, right ear: Secondary | ICD-10-CM | POA: Diagnosis not present

## 2019-04-20 ENCOUNTER — Other Ambulatory Visit: Payer: Self-pay | Admitting: *Deleted

## 2019-04-20 DIAGNOSIS — I83812 Varicose veins of left lower extremities with pain: Secondary | ICD-10-CM

## 2019-05-05 DIAGNOSIS — H60311 Diffuse otitis externa, right ear: Secondary | ICD-10-CM | POA: Diagnosis not present

## 2019-05-11 DIAGNOSIS — H6123 Impacted cerumen, bilateral: Secondary | ICD-10-CM | POA: Diagnosis not present

## 2019-05-27 DIAGNOSIS — Z01419 Encounter for gynecological examination (general) (routine) without abnormal findings: Secondary | ICD-10-CM | POA: Diagnosis not present

## 2019-05-27 DIAGNOSIS — Z1231 Encounter for screening mammogram for malignant neoplasm of breast: Secondary | ICD-10-CM | POA: Diagnosis not present

## 2019-05-27 DIAGNOSIS — R87610 Atypical squamous cells of undetermined significance on cytologic smear of cervix (ASC-US): Secondary | ICD-10-CM | POA: Diagnosis not present

## 2019-05-27 DIAGNOSIS — Z6823 Body mass index (BMI) 23.0-23.9, adult: Secondary | ICD-10-CM | POA: Diagnosis not present

## 2019-06-17 ENCOUNTER — Encounter: Payer: Self-pay | Admitting: Vascular Surgery

## 2019-06-17 ENCOUNTER — Ambulatory Visit: Payer: BC Managed Care – PPO | Admitting: Vascular Surgery

## 2019-06-17 ENCOUNTER — Other Ambulatory Visit: Payer: Self-pay

## 2019-06-17 VITALS — BP 123/85 | HR 75 | Temp 97.3°F | Resp 16 | Ht 68.0 in | Wt 150.0 lb

## 2019-06-17 DIAGNOSIS — I83812 Varicose veins of left lower extremities with pain: Secondary | ICD-10-CM

## 2019-06-17 DIAGNOSIS — I83813 Varicose veins of bilateral lower extremities with pain: Secondary | ICD-10-CM

## 2019-06-17 HISTORY — PX: ENDOVENOUS ABLATION SAPHENOUS VEIN W/ LASER: SUR449

## 2019-06-17 NOTE — Progress Notes (Signed)
     Laser Ablation Procedure    Date: 06/17/2019   Samantha Dunn DOB:Apr 03, 1971  Consent signed: Yes    Surgeon: Gae Gallop MD   Procedure: Laser Ablation: left Small Saphenous Vein  BP 123/85 (BP Location: Left Arm, Patient Position: Sitting, Cuff Size: Normal)   Pulse 75   Temp (!) 97.3 F (36.3 C) (Temporal)   Resp 16   Ht 5\' 8"  (1.727 m)   Wt 150 lb (68 kg)   SpO2 100%   BMI 22.81 kg/m   Tumescent Anesthesia: 350 cc 0.9% NaCl with 50 cc Lidocaine HCL 1%  and 15 cc 8.4% NaHCO3  Local Anesthesia: 6 cc Lidocaine HCL and NaHCO3 (ratio 2:1)  15 watts continuous mode        Total energy: 1701 Joules    Total time: 1:53  Laser Fiber Ref. # [id number]      Lot # [id number]   Stab Phlebectomy: 10-20 Sites: Calf and Ankle  Patient tolerated procedure well  Notes: Patient wore face mask.  All staff members wore facial masks and facial shields/goggles.    Description of Procedure:  After marking the course of the secondary varicosities, the patient was placed on the operating table in the prone position, and the left leg was prepped and draped in sterile fashion.   Local anesthetic was administered and under ultrasound guidance the saphenous vein was accessed with a micro needle and guide wire; then the mirco puncture sheath was placed.  A guide wire was inserted saphenopopliteal junction , followed by a 5 french sheath.  The position of the sheath and then the laser fiber below the junction was confirmed using the ultrasound.  Tumescent anesthesia was administered along the course of the saphenous vein using ultrasound guidance. The patient was placed in Trendelenburg position and protective laser glasses were placed on patient and staff, and the laser was fired at 15 watts continuous mode advancing 1-67mm/second for a total of 1701 joules.   For stab phlebectomies, local anesthetic was administered at the previously marked varicosities, and tumescent anesthesia was  administered around the vessels.  Ten to 20 stab wounds were made using the tip of an 11 blade. And using the vein hook, the phlebectomies were performed using a hemostat to avulse the varicosities.  Adequate hemostasis was achieved.     Steri strips were applied to the stab wounds and ABD pads and thigh high compression stockings were applied.  Ace wrap bandages were applied over the phlebectomy sites and at the top of the saphenopopliteal junction. Blood loss was less than 15 cc.  Discharge instructions reviewed with patient and hardcopy of discharge instructions given to patient to take home. The patient ambulated out of the operating room having tolerated the procedure well.

## 2019-06-17 NOTE — Progress Notes (Signed)
   Patient name: DAWNA JAKES MRN: [id number] DOB: March 18, 1971 Sex: female  REASON FOR VISIT:   For laser ablation left short saphenous vein and 10-20 stabs  HPI:   ELLYSSA ZAGAL is a pleasant 48 y.o. 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  She failed conservative treatment and presents for laser ablation left short saphenous vein and 10-20 stabs.  Current Outpatient Medications  Medication Sig Dispense Refill  . AMBULATORY NON FORMULARY MEDICATION Dexamethasone Liquid Solution 0.4% (Patient not taking: Reported on 04/01/2019) 2 oz 0  . amoxicillin-clavulanate (AUGMENTIN) 875-125 MG tablet TAKE 1 TABLET BY MOUTH EVERY 12 HOURS FOR 10 DAYS    . nitroGLYCERIN (NITRODUR - DOSED IN MG/24 HR) 0.2 mg/hr patch Use 1/4 patch daily to the affected area. (Patient not taking: Reported on 04/01/2019) 30 patch 1   No current facility-administered medications for this visit.     REVIEW OF SYSTEMS:  [X]  denotes positive finding, [ ]  denotes negative finding Vascular    Leg swelling    Cardiac    Chest pain or chest pressure:    Shortness of breath upon exertion:    Short of breath when lying flat:    Irregular heart rhythm:    Constitutional    Fever or chills:     PHYSICAL EXAM:   Vitals:   06/17/19 0828  BP: 123/85  Pulse: 75  Resp: 16  Temp: (!) 97.3 F (36.3 C)  TempSrc: Temporal  SpO2: 100%  Weight: 150 lb (68 kg)  Height: 5\' 8"  (1.727 m)    GENERAL: The patient is a well-nourished female  DATA:   No new data  MEDICAL ISSUES:   LASER ABLATION LEFT SHORT SAPHENOUS VEIN/10-20 STABS: The patient was taken to the exam room and the dilated varicose veins were marked with the patient standing.  The patient was then placed prone.  I examined the small saphenous vein with the SonoSite and it looks like he could be cannulated in the mid to distal calf.  The left leg was prepped and draped in usual sterile fashion.  Under  ultrasound guidance, after the skin was anesthetized, I cannulated the left short saphenous vein in the mid to distal calf.  A micropuncture sheath was introduced over wire.  I then advanced the J-wire to the proximal aspect of the short saphenous vein well away from the popliteal vein.  The sheath was then advanced over the wire and the wire and dilator removed.  The laser fiber was positioned at the end of the sheath.  We were approximately 3-1/2 cm from the popliteal vein.  Tumescent anesthesia was then administered the entire length of the vein circumferentially around the vein.  Patient was then placed in Trendelenburg.  Laser ablation was performed of the small saphenous vein.  This was a fairly long vein and a total of 1700 J of energy were used.  Next attention was turned to the stab phlebectomies.  Small stab incisions were made over the marked areas and then using the hook the vein was retracted into the wound and bluntly excised.  Approximately 15 stabs were made.  Pressure was held for hemostasis.  Sterile dressing was applied.  The patient will return in 1 week for follow-up duplex.  Instructions have been reviewed with the patient.  Deitra Mayo [facility] [phone]

## 2019-06-24 ENCOUNTER — Encounter: Payer: Self-pay | Admitting: Vascular Surgery

## 2019-06-24 ENCOUNTER — Other Ambulatory Visit: Payer: Self-pay

## 2019-06-24 ENCOUNTER — Ambulatory Visit (INDEPENDENT_AMBULATORY_CARE_PROVIDER_SITE_OTHER): Payer: BC Managed Care – PPO | Admitting: Vascular Surgery

## 2019-06-24 ENCOUNTER — Ambulatory Visit (HOSPITAL_COMMUNITY)
Admission: RE | Admit: 2019-06-24 | Discharge: 2019-06-24 | Disposition: A | Discharge status: Home / Self Care | Payer: BC Managed Care – PPO | Source: Ambulatory Visit | Attending: Vascular Surgery | Admitting: Vascular Surgery

## 2019-06-24 VITALS — BP 122/83 | HR 64 | Temp 97.8°F | Resp 14

## 2019-06-24 DIAGNOSIS — Z48812 Encounter for surgical aftercare following surgery on the circulatory system: Secondary | ICD-10-CM | POA: Diagnosis not present

## 2019-06-24 DIAGNOSIS — I83812 Varicose veins of left lower extremities with pain: Secondary | ICD-10-CM | POA: Diagnosis not present

## 2019-06-24 MED ORDER — RIVAROXABAN (XARELTO) VTE STARTER PACK (15 & 20 MG): ORAL_TABLET | ORAL | 0 refills | Status: AC

## 2019-06-24 NOTE — Progress Notes (Signed)
   Patient name: Samantha Dunn MRN: [id number] DOB: [birth year] Sex: female  REASON FOR VISIT:   Follow-up after laser ablation of the left short saphenous vein and 10-20 stabs  HPI:   Samantha Dunn is a pleasant 48 y.o. 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  On 06/17/2019 she underwent laser ablation of the left short saphenous vein and 10-20 stabs.  Of note we positioned the fiber 3-1/2 cm from the popliteal vein.  Today the patient has no specific complaints.  She has had no significant left leg swelling with minimal bruising.  Current Outpatient Medications  Medication Sig Dispense Refill  . AMBULATORY NON FORMULARY MEDICATION Dexamethasone Liquid Solution 0.4% (Patient not taking: Reported on 04/01/2019) 2 oz 0  . amoxicillin-clavulanate (AUGMENTIN) 875-125 MG tablet TAKE 1 TABLET BY MOUTH EVERY 12 HOURS FOR 10 DAYS    . nitroGLYCERIN (NITRODUR - DOSED IN MG/24 HR) 0.2 mg/hr patch Use 1/4 patch daily to the affected area. (Patient not taking: Reported on 04/01/2019) 30 patch 1  . Rivaroxaban 15 & 20 MG TBPK Follow package directions: Take one 15mg  tablet by mouth twice a day. On day 22, switch to one 20mg  tablet once a day. Take with food. 51 each 0   No current facility-administered medications for this visit.     REVIEW OF SYSTEMS:  [X]  denotes positive finding, [ ]  denotes negative finding Vascular    Leg swelling    Cardiac    Chest pain or chest pressure:    Shortness of breath upon exertion:    Short of breath when lying flat:    Irregular heart rhythm:    Constitutional    Fever or chills:     PHYSICAL EXAM:   Vitals:   06/24/19 1023  BP: 122/83  Pulse: 64  Resp: 14  Temp: 97.8 F (36.6 C)  TempSrc: Temporal  SpO2: 100%    GENERAL: The patient is a well-nourished female, in no acute distress. The vital signs are documented above. CARDIOVASCULAR: There is a regular rate  and rhythm. PULMONARY: There is good air exchange bilaterally without wheezing or rales. She has no significant left leg swelling. Her stab incisions are healing nicely. She has mild bruising.  DATA:   VENOUS DUPLEX: I have independently interpreted her venous duplex scan today.  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  I looked myself with the SonoSite and was able to compress the popliteal vein above and below this saphenous popliteal junction.  However there was some images which suggested possible extension of the clot into the popliteal vein.  MEDICAL ISSUES:   STATUS POST LASER ABLATION LEFT SMALL SAPHENOUS VEIN AND 10-20 STABS: The patient is doing well status post laser ablation and stab phlebectomies.  However duplex scan suggest possible extension into the popliteal vein despite the fact that we began ablation 3-1/2 cm to the saphenous popliteal junction.  Imaging was somewhat challenging however I feel that the safest course is to put her on Xarelto and do a follow-up scan in 3 weeks which I have ordered.  I will see her back at that time.  She knows to call sooner if she has problems.  I have encouraged her to continue to elevate her legs as much as possible, continue walking, and continue wearing her compression stockings.  Deitra Mayo [facility] [phone]

## 2019-07-09 DIAGNOSIS — Z0000 Encounter for general adult medical examination without abnormal findings: Secondary | ICD-10-CM | POA: Diagnosis not present

## 2019-07-09 DIAGNOSIS — Z1159 Encounter for screening for other viral diseases: Secondary | ICD-10-CM | POA: Diagnosis not present

## 2019-07-13 DIAGNOSIS — H6121 Impacted cerumen, right ear: Secondary | ICD-10-CM | POA: Diagnosis not present

## 2019-07-13 DIAGNOSIS — Z23 Encounter for immunization: Secondary | ICD-10-CM | POA: Diagnosis not present

## 2019-07-13 DIAGNOSIS — H6691 Otitis media, unspecified, right ear: Secondary | ICD-10-CM | POA: Diagnosis not present

## 2019-07-13 DIAGNOSIS — Z0000 Encounter for general adult medical examination without abnormal findings: Secondary | ICD-10-CM | POA: Diagnosis not present

## 2019-07-13 DIAGNOSIS — Z6823 Body mass index (BMI) 23.0-23.9, adult: Secondary | ICD-10-CM | POA: Diagnosis not present

## 2019-07-15 DIAGNOSIS — H6123 Impacted cerumen, bilateral: Secondary | ICD-10-CM | POA: Diagnosis not present

## 2019-07-20 ENCOUNTER — Other Ambulatory Visit: Payer: Self-pay

## 2019-07-20 DIAGNOSIS — R609 Edema, unspecified: Secondary | ICD-10-CM

## 2019-07-22 ENCOUNTER — Encounter: Payer: Self-pay | Admitting: Vascular Surgery

## 2019-07-22 ENCOUNTER — Other Ambulatory Visit: Payer: Self-pay

## 2019-07-22 ENCOUNTER — Ambulatory Visit (INDEPENDENT_AMBULATORY_CARE_PROVIDER_SITE_OTHER): Payer: Self-pay | Admitting: Vascular Surgery

## 2019-07-22 ENCOUNTER — Ambulatory Visit (HOSPITAL_COMMUNITY)
Admission: RE | Admit: 2019-07-22 | Discharge: 2019-07-22 | Disposition: A | Discharge status: Home / Self Care | Payer: BC Managed Care – PPO | Source: Ambulatory Visit | Attending: Family | Admitting: Family

## 2019-07-22 VITALS — BP 112/78 | HR 66 | Temp 97.6°F | Resp 16 | Ht 68.0 in | Wt 149.1 lb

## 2019-07-22 DIAGNOSIS — I83812 Varicose veins of left lower extremities with pain: Secondary | ICD-10-CM

## 2019-07-22 DIAGNOSIS — Z48812 Encounter for surgical aftercare following surgery on the circulatory system: Secondary | ICD-10-CM

## 2019-07-22 DIAGNOSIS — R609 Edema, unspecified: Secondary | ICD-10-CM | POA: Insufficient documentation

## 2019-07-22 NOTE — Progress Notes (Signed)
   Patient name: Samantha Dunn MRN: [id number] DOB: 1971-02-24 Sex: female  REASON FOR VISIT:   Follow-up after laser ablation of the left small saphenous vein with 10-20 stabs  HPI:   Samantha Dunn is a pleasant 48 y.o. female who underwent laser ablation of the left short saphenous vein with 10-20 stabs on 06/17/2019.  I positioned the laser 3-1/2 cm distal to the popliteal vein.  She was seen in follow-up on 06/24/2019.  Her follow-up duplex scan at that time showed possible extension of the clot into the popliteal vein consistent with an EHIT-3, visualization was limited however I felt the safest approach was to put her on Xarelto and do a follow-up scan in 3 weeks.  She comes in today for that follow-up study.  Since I saw her last, she has no specific complaints.  She has been walking and gradually resuming her normal activities.  She is had no significant left leg swelling.  Current Outpatient Medications  Medication Sig Dispense Refill  . AMBULATORY NON FORMULARY MEDICATION Dexamethasone Liquid Solution 0.4% 2 oz 0  . amoxicillin-clavulanate (AUGMENTIN) 875-125 MG tablet TAKE 1 TABLET BY MOUTH EVERY 12 HOURS FOR 10 DAYS    . nitroGLYCERIN (NITRODUR - DOSED IN MG/24 HR) 0.2 mg/hr patch Use 1/4 patch daily to the affected area. 30 patch 1  . Rivaroxaban 15 & 20 MG TBPK Follow package directions: Take one 15mg  tablet by mouth twice a day. On day 22, switch to one 20mg  tablet once a day. Take with food. 51 each 0   No current facility-administered medications for this visit.     REVIEW OF SYSTEMS:  [X]  denotes positive finding, [ ]  denotes negative finding Vascular    Leg swelling    Cardiac    Chest pain or chest pressure:    Shortness of breath upon exertion:    Short of breath when lying flat:    Irregular heart rhythm:    Constitutional    Fever or chills:     PHYSICAL EXAM:   Vitals:   07/22/19 0950  BP: 112/78  Pulse: 66  Resp: 16  Temp: 97.6 F (36.4  C)  TempSrc: Temporal  SpO2: 100%  Weight: 149 lb 1.6 oz (67.6 kg)  Height: 5\' 8"  (1.727 m)    GENERAL: The patient is a well-nourished female, in no acute distress. The vital signs are documented above. She has no significant left leg swelling. Her small stab incisions are all healing nicely.  DATA:   VENOUS DUPLEX: I benefit interpreted her venous duplex scan today.  There is no evidence of DVT on the left.  The clot is successfully closed from 1.73 cm distal to the saphenous popliteal junction to the mid calf.  MEDICAL ISSUES:   STATUS POST LASER ABLATION LEFT SMALL SAPHENOUS VEIN WITH 10-20 STABS: The patient is doing very well status post laser ablation of her left small saphenous vein with 10-20 stabs.  The finding seen on her initial follow-up duplex has resolved.  For this reason I think we can stop her Xarelto.  Overall pleased with her progress and I will see her back as needed.  She has no further procedures planned.  Deitra Mayo [facility] [place] [phone]

## 2019-08-31 ENCOUNTER — Encounter: Payer: Self-pay | Admitting: Vascular Surgery

## 2019-12-08 DIAGNOSIS — H6123 Impacted cerumen, bilateral: Secondary | ICD-10-CM | POA: Diagnosis not present

## 2019-12-08 DIAGNOSIS — H900 Conductive hearing loss, bilateral: Secondary | ICD-10-CM | POA: Diagnosis not present

## 2019-12-11 ENCOUNTER — Ambulatory Visit: Payer: BC Managed Care – PPO | Attending: Internal Medicine

## 2019-12-11 DIAGNOSIS — Z23 Encounter for immunization: Secondary | ICD-10-CM

## 2019-12-11 NOTE — Progress Notes (Signed)
   Covid-19 Vaccination Clinic  Name:  Samantha Dunn    MRN: [id number] DOB: [birth year]  12/11/2019  [name] was observed post Covid-19 immunization for 15 minutes without incident. She was provided with Vaccine Information Sheet and instruction to access the V-Safe system.   [name] was instructed to call 911 with any severe reactions post vaccine: Marland Kitchen Difficulty breathing  . Swelling of face and throat  . A fast heartbeat  . A bad rash all over body  . Dizziness and weakness   Immunizations Administered    Name Date Dose VIS Date Route   Pfizer COVID-19 Vaccine 12/11/2019  3:45 PM 0.3 mL 09/10/2019 Intramuscular   Manufacturer: [employer], [employer]   Lot: [id number]   NDC: [address]-9

## 2020-01-05 ENCOUNTER — Ambulatory Visit: Payer: BC Managed Care – PPO | Attending: Internal Medicine

## 2020-01-05 DIAGNOSIS — Z23 Encounter for immunization: Secondary | ICD-10-CM

## 2020-01-05 NOTE — Progress Notes (Signed)
   Covid-19 Vaccination Clinic  Name:  Samantha Dunn    MRN: [id number] DOB: 06-20-1971  01/05/2020  [name] was observed post Covid-19 immunization for 15 minutes without incident. She was provided with Vaccine Information Sheet and instruction to access the V-Safe system.   [name] was instructed to call 911 with any severe reactions post vaccine: Marland Kitchen Difficulty breathing  . Swelling of face and throat  . A fast heartbeat  . A bad rash all over body  . Dizziness and weakness   Immunizations Administered    Name Date Dose VIS Date Route   Pfizer COVID-19 Vaccine 01/05/2020  4:24 PM 0.3 mL 09/10/2019 Intramuscular   Manufacturer: [employer], [employer]   Lot: [id number]   NDC: [address]-5

## 2020-06-12 DIAGNOSIS — H6121 Impacted cerumen, right ear: Secondary | ICD-10-CM | POA: Diagnosis not present

## 2020-06-14 ENCOUNTER — Other Ambulatory Visit: Payer: BC Managed Care – PPO

## 2020-06-14 ENCOUNTER — Other Ambulatory Visit: Payer: Self-pay

## 2020-06-14 DIAGNOSIS — Z20822 Contact with and (suspected) exposure to covid-19: Secondary | ICD-10-CM | POA: Diagnosis not present

## 2020-06-17 LAB — NOVEL CORONAVIRUS, NAA: SARS-CoV-2, NAA: NOT DETECTED

## 2020-07-14 DIAGNOSIS — Z1159 Encounter for screening for other viral diseases: Secondary | ICD-10-CM | POA: Diagnosis not present

## 2020-07-14 DIAGNOSIS — Z0000 Encounter for general adult medical examination without abnormal findings: Secondary | ICD-10-CM | POA: Diagnosis not present

## 2020-07-18 DIAGNOSIS — Z0000 Encounter for general adult medical examination without abnormal findings: Secondary | ICD-10-CM | POA: Diagnosis not present

## 2020-07-18 DIAGNOSIS — Z23 Encounter for immunization: Secondary | ICD-10-CM | POA: Diagnosis not present

## 2020-07-25 DIAGNOSIS — Z1231 Encounter for screening mammogram for malignant neoplasm of breast: Secondary | ICD-10-CM | POA: Diagnosis not present

## 2020-07-25 DIAGNOSIS — Z01419 Encounter for gynecological examination (general) (routine) without abnormal findings: Secondary | ICD-10-CM | POA: Diagnosis not present

## 2020-07-25 DIAGNOSIS — Z6823 Body mass index (BMI) 23.0-23.9, adult: Secondary | ICD-10-CM | POA: Diagnosis not present

## 2020-08-11 ENCOUNTER — Encounter: Payer: Self-pay | Admitting: Gastroenterology

## 2020-09-18 ENCOUNTER — Telehealth: Payer: Self-pay | Admitting: *Deleted

## 2020-09-18 NOTE — Telephone Encounter (Signed)
Pt has Xarelto listed on her medication list  Attempted to call pt to see if she is on Xarelto- no answer- LM to call back about Xarelto

## 2020-09-19 NOTE — Telephone Encounter (Signed)
Pt has canceled her Colon as states insurance will not cover  Placed an appointment note that she will need an Ov if she RS FIRST due to her Blood thinner Xarelto   Hilda Lias PV

## 2020-10-11 ENCOUNTER — Encounter: Payer: BC Managed Care – PPO | Admitting: Gastroenterology

## 2021-05-17 DIAGNOSIS — H6121 Impacted cerumen, right ear: Secondary | ICD-10-CM | POA: Diagnosis not present

## 2021-06-14 ENCOUNTER — Ambulatory Visit: Payer: BC Managed Care – PPO | Admitting: Sports Medicine

## 2021-06-14 ENCOUNTER — Other Ambulatory Visit: Payer: Self-pay

## 2021-06-14 VITALS — Ht 68.0 in | Wt 150.0 lb

## 2021-06-14 DIAGNOSIS — M25561 Pain in right knee: Secondary | ICD-10-CM | POA: Diagnosis not present

## 2021-06-14 MED ORDER — MELOXICAM 15 MG PO TABS: 15.0000 mg | ORAL_TABLET | Freq: Every day | ORAL | 0 refills | Status: DC

## 2021-06-14 NOTE — Patient Instructions (Addendum)
It was great to meet you today!  For your knee we discussed the following: -Please go get x-rays when you leave here today -Start doing the exercises we gave you, be sure to avoid any deep bending exercises in your workouts as well -Wear the patellar tendon strap with activity -Follow up with me [place] in 3 weeks  Call us with any questions in the meantime!

## 2021-06-14 NOTE — Assessment & Plan Note (Signed)
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  Will obtain XR for baseline, will advise of results.  Given quad strengthening HEP.  Also given Rx for mobic to trial, per notes she is no longer on Xarelto, was short term for vascular procedure in 2020.  If no improvement, could consider formal PT, CSI.  F/U prn.

## 2021-06-14 NOTE — Progress Notes (Signed)
   Samantha Dunn is a 50 y.o. 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  ROS as above. Medications reviewed.  Exam:  Ht 5\' 8"  (1.727 m)   Wt 150 lb (68 kg)   BMI 22.81 kg/m  Gen: Well NAD MSK:  Right Knee: - Inspection: no gross deformity b/l. Mild effusion of right knee, erythema or bruising b/l. Skin intact - Palpation: no TTP b/l - ROM: full active ROM with flexion and extension in knee and hip b/l, she has 2+ crepitus in right, left 1+ crepitus - Strength: 5/5 strength b/l aside from 4/5 strength hip abductors - Neuro/vasc: NV intact distally b/l - Special Tests: - LIGAMENTS: negative anterior and posterior drawer, negative Lachman's, no MCL or LCL laxity  -- MENISCUS: equivocal McMurray's, equivocal Thessaly  -- PF JOINT: tight retinaculum on right.  positive patellar grind, negative patellar apprehension  Hips: normal ROM, negative FABER and FADIR bilaterally   MSK ultrasound right knee: Images were obtained both in the transverse and longitudinal plane. Patellar and quadriceps tendons were well visualized with no abnormalities. Mild effusion. Medial and lateral menisci were well visualized with no abnormalities. Trochlear groove with significant osteophytes noted   No results found.   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  Will obtain XR for baseline, will advise of results.  Given quad strengthening HEP.  Also given Rx for mobic to trial, per notes she is no  longer on Xarelto, was short term for vascular procedure in 2020.  If no improvement, could consider formal PT, CSI.  F/U prn.  2021, D.O.  PGY-4 [facility]  06/14/2021 4:35 PM

## 2021-06-15 ENCOUNTER — Ambulatory Visit
Admission: RE | Admit: 2021-06-15 | Discharge: 2021-06-15 | Disposition: A | Discharge status: Home / Self Care | Payer: BC Managed Care – PPO | Source: Ambulatory Visit | Attending: Sports Medicine | Admitting: Sports Medicine

## 2021-06-15 DIAGNOSIS — M25561 Pain in right knee: Secondary | ICD-10-CM

## 2021-06-21 ENCOUNTER — Telehealth: Payer: Self-pay | Admitting: Sports Medicine

## 2021-06-21 NOTE — Telephone Encounter (Signed)
  I spoke with Samantha Dunn on the phone today after reviewing x-rays of her right knee.  X-rays do not show any significant arthritic changes.  She does have a small joint effusion.  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  She is doing well with her home exercises.  She has a follow-up appointment with me on October 6.  If symptoms persist at follow-up then we will consider merits of cortisone injection, formal physical therapy, or both.

## 2021-07-05 ENCOUNTER — Ambulatory Visit: Payer: BC Managed Care – PPO | Admitting: Sports Medicine

## 2021-07-05 VITALS — Ht 68.0 in | Wt 150.0 lb

## 2021-07-05 DIAGNOSIS — M25561 Pain in right knee: Secondary | ICD-10-CM

## 2021-07-05 DIAGNOSIS — M2241 Chondromalacia patellae, right knee: Secondary | ICD-10-CM | POA: Diagnosis not present

## 2021-07-05 MED ORDER — METHYLPREDNISOLONE ACETATE 40 MG/ML IJ SUSP
40.0000 mg | Freq: Once | INTRAMUSCULAR | Status: AC
  Administered 2021-07-05: 40 mg via INTRA_ARTICULAR

## 2021-07-05 NOTE — Progress Notes (Signed)
   Subjective:    Patient ID: Samantha Dunn, female    DOB: May 14, 1971, 50 y.o.   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  She is still having discomfort, especially with navigating stairs.  She has been doing her home exercises.  She has been avoiding squats and lunges.  She would be interested in trying a cortisone injection.  Previous x-rays showed no significant arthritic changes in the right knee.    Review of Systems As above    Objective:   Physical Exam  Well-developed, well-nourished.  No acute distress  Right knee: Full range of motion.  No obvious effusion.  2+ patellofemoral crepitus.  Knee remains grossly stable to ligamentous exam.  Neurovascularly intact distally.      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  This was accomplished atraumatically under sterile technique after consent was obtained.  An anterior lateral approach was utilized.  I would also like for [name] to start formal physical therapy.  I will not schedule a follow-up visit but I did explain to [name] that if her symptoms persist for another 4 weeks despite today's injection and starting formal therapy then we may want to consider merits of MRI.  She will call me if that is the case.  Otherwise, follow-up as needed.  Consent obtained and verified. Time-out conducted. Noted no overlying erythema, induration, or other signs of local infection. Skin prepped in a sterile fashion. Topical analgesic spray: Ethyl chloride. Joint: Right knee Needle: 25-gauge 1.5 inch Completed without difficulty. Meds: 3 cc 1% Xylocaine, 1 cc (40 mg) Depo-Medrol  Advised to call if fevers/chills, erythema, induration, drainage, or persistent bleeding.   This note was dictated using Dragon naturally speaking software and may contain  errors in syntax, spelling, or content which have not been identified prior to signing this note.

## 2021-07-20 DIAGNOSIS — Z0000 Encounter for general adult medical examination without abnormal findings: Secondary | ICD-10-CM | POA: Diagnosis not present

## 2021-07-24 DIAGNOSIS — Z0000 Encounter for general adult medical examination without abnormal findings: Secondary | ICD-10-CM | POA: Diagnosis not present

## 2021-08-06 DIAGNOSIS — Z1231 Encounter for screening mammogram for malignant neoplasm of breast: Secondary | ICD-10-CM | POA: Diagnosis not present

## 2021-08-06 DIAGNOSIS — Z01419 Encounter for gynecological examination (general) (routine) without abnormal findings: Secondary | ICD-10-CM | POA: Diagnosis not present

## 2021-08-06 DIAGNOSIS — Z6823 Body mass index (BMI) 23.0-23.9, adult: Secondary | ICD-10-CM | POA: Diagnosis not present

## 2021-10-10 IMAGING — CR DG KNEE AP/LAT W/ SUNRISE*R*
3 series · 3 of 3 positions shown · non-contrast
Comparison: None.

CLINICAL DATA: Atraumatic anterior right knee pain x4 months.

EXAM:
RIGHT KNEE 3 VIEWS

[w knee ap right *]
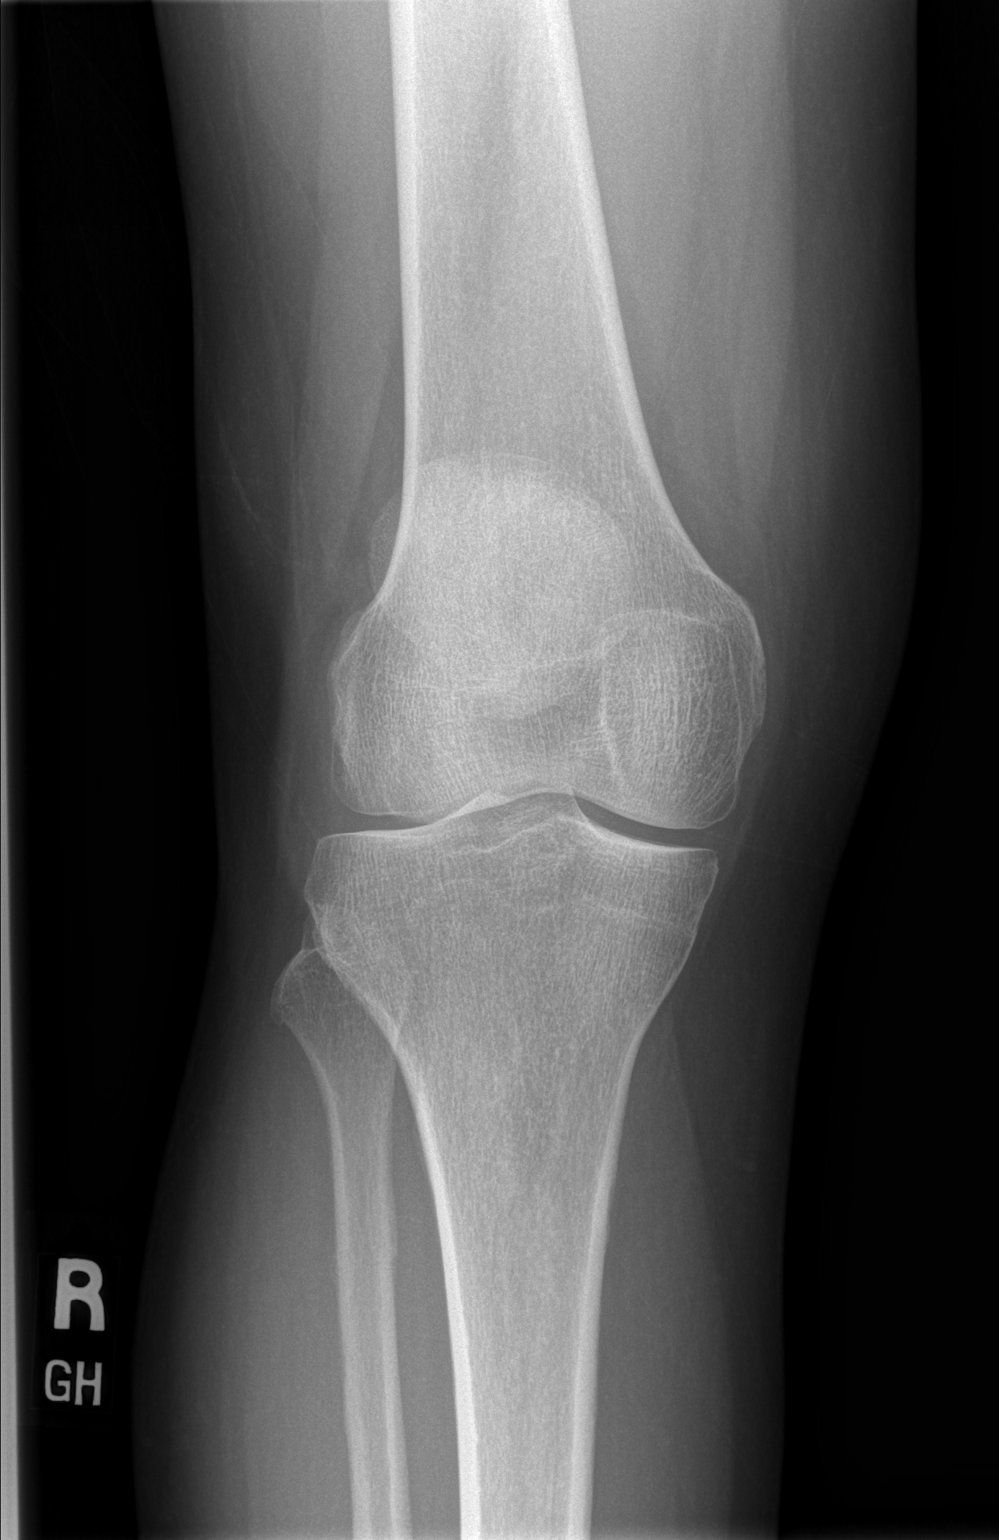

[w knee lat. right]
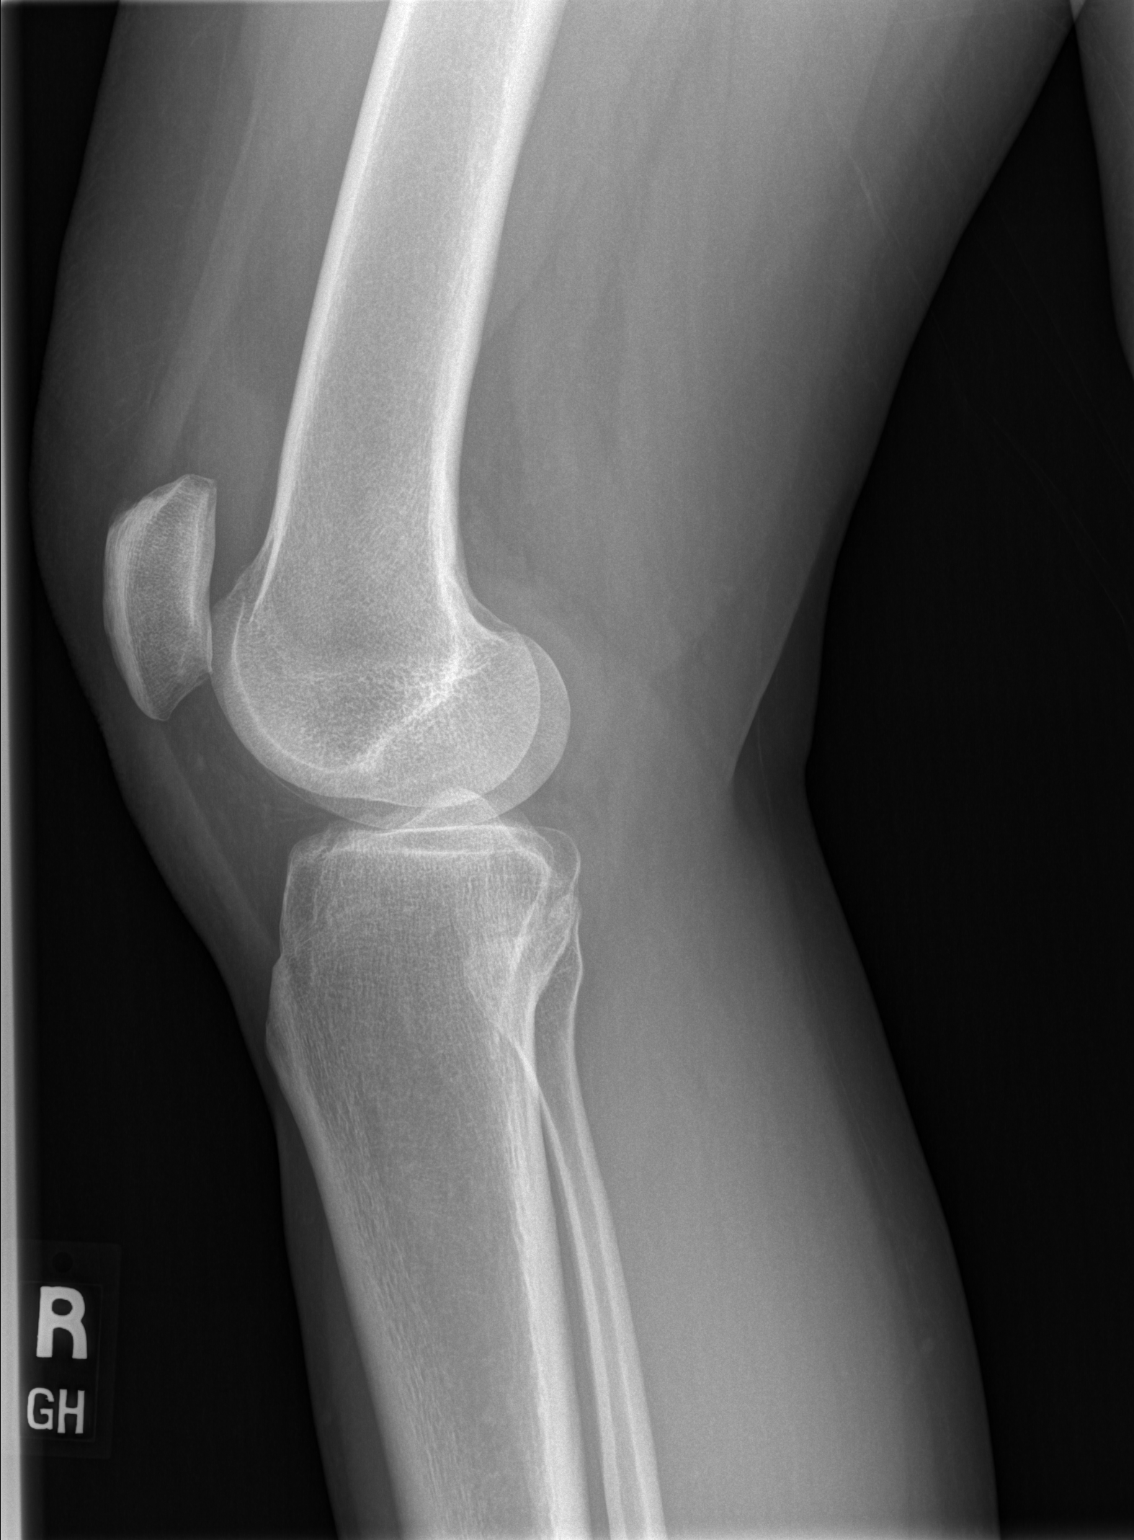

[t knee oblique right]
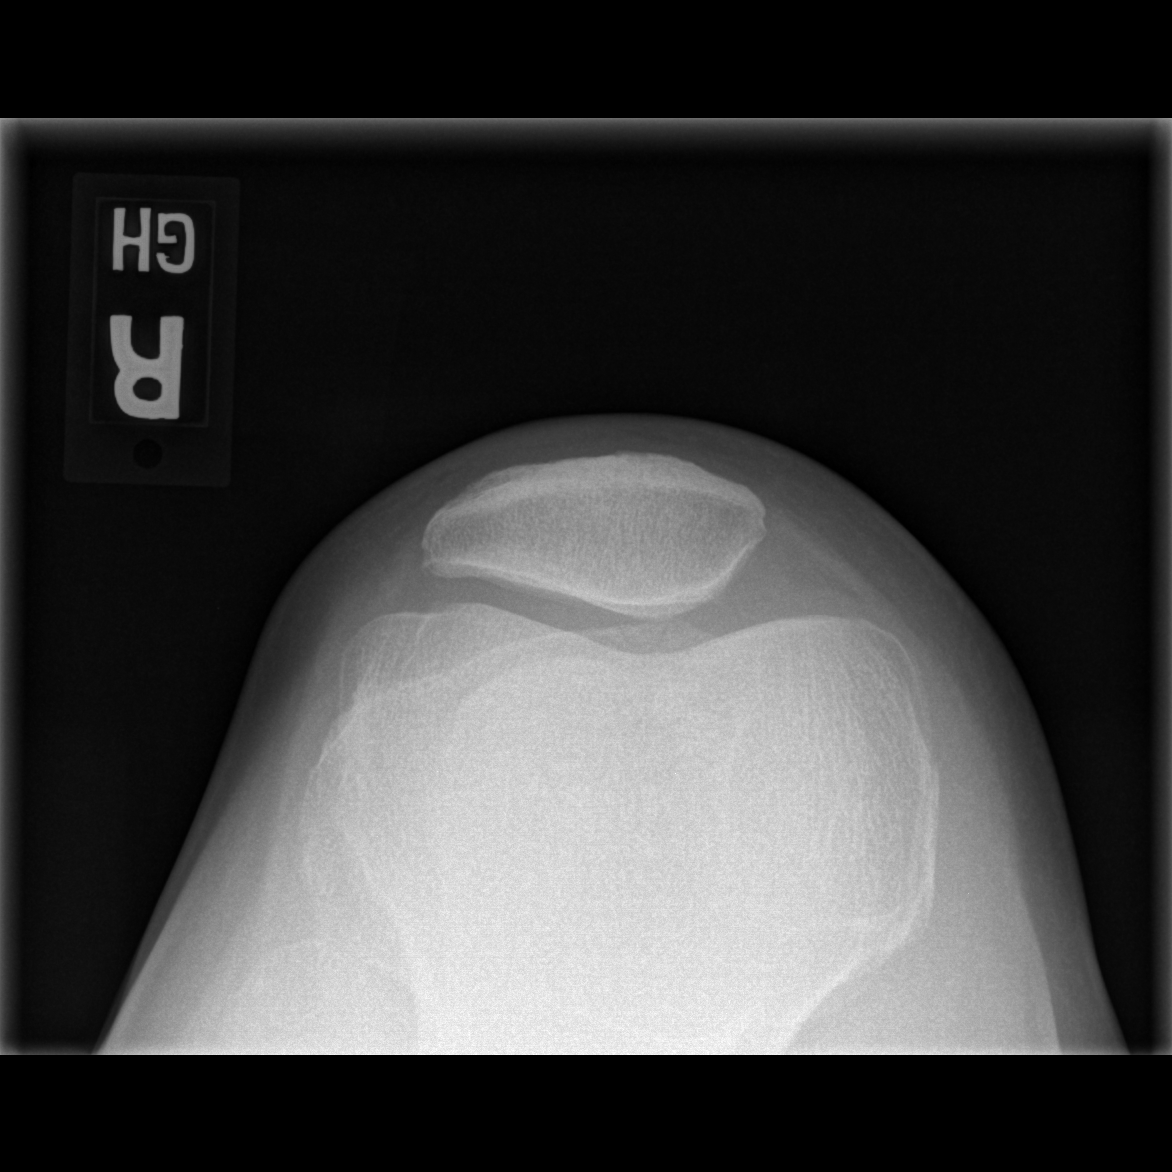

[3 of 3 positions shown; findings below may reference images not displayed]

FINDINGS: No evidence of an acute fracture or dislocation. No evidence of
arthropathy or other focal bone abnormality. A small joint effusion
is noted.
IMPRESSION: 1. No acute osseous abnormality.
2. Small joint effusion.

## 2021-10-30 DIAGNOSIS — H6121 Impacted cerumen, right ear: Secondary | ICD-10-CM | POA: Diagnosis not present

## 2022-01-28 DIAGNOSIS — H6121 Impacted cerumen, right ear: Secondary | ICD-10-CM | POA: Diagnosis not present

## 2022-09-11 DIAGNOSIS — Z01419 Encounter for gynecological examination (general) (routine) without abnormal findings: Secondary | ICD-10-CM | POA: Diagnosis not present

## 2022-09-11 DIAGNOSIS — Z6824 Body mass index (BMI) 24.0-24.9, adult: Secondary | ICD-10-CM | POA: Diagnosis not present

## 2022-09-11 DIAGNOSIS — Z1231 Encounter for screening mammogram for malignant neoplasm of breast: Secondary | ICD-10-CM | POA: Diagnosis not present

## 2022-09-11 DIAGNOSIS — Z124 Encounter for screening for malignant neoplasm of cervix: Secondary | ICD-10-CM | POA: Diagnosis not present

## 2023-03-05 DIAGNOSIS — H6121 Impacted cerumen, right ear: Secondary | ICD-10-CM | POA: Diagnosis not present

## 2023-08-14 DIAGNOSIS — Z1331 Encounter for screening for depression: Secondary | ICD-10-CM | POA: Diagnosis not present

## 2023-08-14 DIAGNOSIS — Z0000 Encounter for general adult medical examination without abnormal findings: Secondary | ICD-10-CM | POA: Diagnosis not present

## 2023-08-14 DIAGNOSIS — Z1339 Encounter for screening examination for other mental health and behavioral disorders: Secondary | ICD-10-CM | POA: Diagnosis not present

## 2023-08-22 DIAGNOSIS — D649 Anemia, unspecified: Secondary | ICD-10-CM | POA: Diagnosis not present

## 2023-09-16 DIAGNOSIS — Z124 Encounter for screening for malignant neoplasm of cervix: Secondary | ICD-10-CM | POA: Diagnosis not present

## 2023-09-16 DIAGNOSIS — Z6824 Body mass index (BMI) 24.0-24.9, adult: Secondary | ICD-10-CM | POA: Diagnosis not present

## 2023-09-16 DIAGNOSIS — Z01419 Encounter for gynecological examination (general) (routine) without abnormal findings: Secondary | ICD-10-CM | POA: Diagnosis not present

## 2023-10-03 DIAGNOSIS — Z1231 Encounter for screening mammogram for malignant neoplasm of breast: Secondary | ICD-10-CM | POA: Diagnosis not present

## 2023-10-29 ENCOUNTER — Encounter: Payer: Self-pay | Admitting: Pediatrics

## 2023-11-20 ENCOUNTER — Ambulatory Visit (AMBULATORY_SURGERY_CENTER): Payer: BC Managed Care – PPO

## 2023-11-20 VITALS — Ht 68.0 in | Wt 165.0 lb

## 2023-11-20 DIAGNOSIS — Z1211 Encounter for screening for malignant neoplasm of colon: Secondary | ICD-10-CM

## 2023-11-20 MED ORDER — SUFLAVE 178.7 G PO SOLR: 1.0000 | Freq: Once | ORAL | 0 refills | Status: AC

## 2023-11-20 NOTE — Progress Notes (Signed)

## 2023-12-02 ENCOUNTER — Encounter: Payer: Self-pay | Admitting: Pediatrics

## 2023-12-02 NOTE — Progress Notes (Unsigned)
 [facility] History and Physical   Primary Care Physician:  Melida Quitter, MD   Reason for Procedure:  Colon cancer screening  Plan:    Screening colonoscopy   HPI: Samantha Dunn is a 53 y.o. female undergoing [place] colonoscopy for colon cancer screening.  This is the patient's first colonoscopy.  Records indicate that there is a pertinent family history of colorectal cancer and colon polyps in the patient's maternal grandfather and paternal grandmother.  Currently denies symptoms of rectal bleeding or change in bowel habits.   Past Medical History:  Diagnosis Date   Migraines    Varicose veins of left lower extremity     Past Surgical History:  Procedure Laterality Date   CESAREAN SECTION     ENDOVENOUS ABLATION SAPHENOUS VEIN W/ LASER Left 06/17/2019   endovenous laser ablation left small saphenous vein and stab phlebectomy 10-20 incisions left leg by Cari Caraway MD     Prior to Admission medications   Medication Sig Start Date End Date Taking? Authorizing Provider  acetic acid 2 % otic solution PLACE 3 DROPS INTO AFFECTED EAR EVERY 6 HOURS FOR 1 3 DAYS 04/06/19   [provider]  ferrous sulfate 324 MG TBEC Take 324 mg by mouth.    [provider]  PROGESTERONE PO Take 75 mg by mouth.    [provider]  Rivaroxaban 15 & 20 MG TBPK Follow package directions: Take one 15mg  tablet by mouth twice a day. On day 22, switch to one 20mg  tablet once a day. Take with food. 06/24/19   Chuck Hint, MD  testosterone (ANDROGEL) 50 MG/5GM (1%) GEL Place 5 g onto the skin daily.    [provider]    Current Outpatient Medications  Medication Sig Dispense Refill   acetic acid 2 % otic solution PLACE 3 DROPS INTO AFFECTED EAR EVERY 6 HOURS FOR 1 3 DAYS     ferrous sulfate 324 MG TBEC Take 324 mg by mouth.     PROGESTERONE PO Take 75 mg by mouth.     Rivaroxaban 15 & 20 MG TBPK Follow package directions: Take one 15mg   tablet by mouth twice a day. On day 22, switch to one 20mg  tablet once a day. Take with food. 51 each 0   testosterone (ANDROGEL) 50 MG/5GM (1%) GEL Place 5 g onto the skin daily.     No current facility-administered medications for this visit.    Allergies as of 12/04/2023 - Review Complete 11/20/2023  Allergen Reaction Noted   Other  06/24/2019    Family History  Problem Relation Age of Onset   Colon polyps Maternal Grandfather    Colon cancer Maternal Grandfather    Colon polyps Paternal Grandmother    Colon cancer Paternal Grandmother    Esophageal cancer Neg Hx    Rectal cancer Neg Hx    Stomach cancer Neg Hx     Social History   Socioeconomic History   Marital status: Married    Spouse name: Not on file   Number of children: Not on file   Years of education: Not on file   Highest education level: Not on file  Occupational History   Not on file  Tobacco Use   Smoking status: Former    Types: Cigarettes   Smokeless tobacco: Never  Substance and Sexual Activity   Alcohol use: Yes   Drug use: Never   Sexual activity: Not on file  Other Topics Concern   Not on  file  Social History Narrative   Not on file   Social Drivers of Health   Financial Resource Strain: Not on file  Food Insecurity: Low Risk  (03/05/2023)   Received from [facility]   Hunger Vital Sign    Worried About Running Out of Food in the Last Year: Never true    Ran Out of Food in the Last Year: Never true  Transportation Needs: No Transportation Needs (03/05/2023)   Received from [facility]    In the past 12 months, has lack of reliable transportation kept you from medical appointments, meetings, work or from getting things needed for daily living? : No  Physical Activity: Not on file  Stress: Not on file  Social Connections: Unknown (11/06/2022)   Received from [facility]   Social Network    Social Network: Not on file  Intimate Partner Violence: Unknown (11/06/2022)    Received from [facility]   HITS    Physically Hurt: Not on file    Insult or Talk Down To: Not on file    Threaten Physical Harm: Not on file    Scream or Curse: Not on file    Review of Systems:  All other review of systems negative except as mentioned in the HPI.  Physical Exam: Vital signs There were no vitals taken for this visit.  General:   Alert,  Well-developed, well-nourished, pleasant and cooperative in NAD Airway:  Mallampati  Lungs:  Clear throughout to auscultation.   Heart:  Regular rate and rhythm; no murmurs, clicks, rubs,  or gallops. Abdomen:  Soft, nontender and nondistended. Normal bowel sounds.   Neuro/Psych:  Normal mood and affect. A and O x 3  Maren Beach, MD [facility]

## 2023-12-04 ENCOUNTER — Encounter: Payer: Self-pay | Admitting: Pediatrics

## 2023-12-04 ENCOUNTER — Ambulatory Visit (AMBULATORY_SURGERY_CENTER): Payer: BC Managed Care – PPO | Admitting: Pediatrics

## 2023-12-04 VITALS — BP 130/79 | HR 60 | Temp 98.1°F | Resp 11 | Ht 68.0 in | Wt 168.0 lb

## 2023-12-04 DIAGNOSIS — D124 Benign neoplasm of descending colon: Secondary | ICD-10-CM

## 2023-12-04 DIAGNOSIS — D122 Benign neoplasm of ascending colon: Secondary | ICD-10-CM

## 2023-12-04 DIAGNOSIS — D123 Benign neoplasm of transverse colon: Secondary | ICD-10-CM | POA: Diagnosis not present

## 2023-12-04 DIAGNOSIS — Z1211 Encounter for screening for malignant neoplasm of colon: Secondary | ICD-10-CM | POA: Diagnosis not present

## 2023-12-04 MED ORDER — SODIUM CHLORIDE 0.9 % IV SOLN: 500.0000 mL | INTRAVENOUS | Status: DC

## 2023-12-04 NOTE — Progress Notes (Signed)
 Pt's states no medical or surgical changes since previsit or office visit.

## 2023-12-04 NOTE — Progress Notes (Signed)
 Called to room to assist during endoscopic procedure.  Patient ID and intended procedure confirmed with present staff. Received instructions for my participation in the procedure from the performing physician.

## 2023-12-04 NOTE — Op Note (Signed)
 [facility] Patient Name: Samantha Dunn Procedure Date: 12/04/2023 8:54 AM MRN: [id number] Endoscopist: Maren Beach , MD, [id number] Age: 53 Referring MD:  Date of Birth: July 22, 1971 Gender: Female Account #: [id number] Procedure:                Colonoscopy Indications:              Screening for colon cancer: Family history of colon                            polyps in distant relative(s), Screening for colon                            cancer: Family history of colorectal cancer in                            multiple 2nd degree relatives Medicines:                Monitored Anesthesia Care Procedure:                Pre-Anesthesia Assessment:                           - Prior to the procedure, a History and Physical                            was performed, and patient medications and                            allergies were reviewed. The patient's tolerance of                            previous anesthesia was also reviewed. The risks                            and benefits of the procedure and the sedation                            options and risks were discussed with the patient.                            All questions were answered, and informed consent                            was obtained. Prior Anticoagulants: The patient has                            taken no anticoagulant or antiplatelet agents. ASA                            Grade Assessment: II - A patient with mild systemic                            disease. After reviewing the risks and benefits,  the patient was deemed in satisfactory condition to                            undergo the procedure.                           After obtaining informed consent, the colonoscope                            was passed under direct vision. Throughout the                            procedure, the patient's blood pressure, pulse, and                            oxygen saturations were  monitored continuously. The                            Olympus Scope SN: [id number] was introduced through                            the anus and advanced to the cecum, identified by                            appendiceal orifice and ileocecal valve. The                            colonoscopy was performed without difficulty. The                            patient tolerated the procedure well. The quality                            of the bowel preparation was good. The ileocecal                            valve, appendiceal orifice, and rectum were                            photographed. Scope In: 9:16:30 AM Scope Out: 9:36:42 AM Scope Withdrawal Time: 0 hours 12 minutes 56 seconds  Total Procedure Duration: 0 hours 20 minutes 12 seconds  Findings:                 The perianal and digital rectal examinations were                            normal. Pertinent negatives include normal                            sphincter tone and no palpable rectal lesions.                           Four sessile polyps were found in the descending  colon and ascending colon. The polyps were 3 to 4                            mm in size. These polyps were removed with a cold                            biopsy forceps. Resection and retrieval were                            complete.                           A 7 mm polyp was found in the transverse colon. The                            polyp was sessile. The polyp was removed with a                            cold snare. Resection and retrieval were complete.                           The retroflexed view of the distal rectum and anal                            verge was normal and showed no anal or rectal                            abnormalities. Complications:            No immediate complications. Estimated blood loss:                            Minimal. Estimated Blood Loss:     Estimated blood loss was minimal. Impression:                - Four 3 to 4 mm polyps in the descending colon and                            in the ascending colon, removed with a cold biopsy                            forceps. Resected and retrieved.                           - One 7 mm polyp in the transverse colon, removed                            with a cold snare. Resected and retrieved.                           - The distal rectum and anal verge are normal on  retroflexion view. Recommendation:           - Discharge patient to home (ambulatory).                           - Await pathology results.                           - Repeat colonoscopy for surveillance based on                            pathology results.                           - The findings and recommendations were discussed                            with the patient's family.                           - Return to referring physician.                           - Patient has a contact number available for                            emergencies. The signs and symptoms of potential                            delayed complications were discussed with the                            patient. Return to normal activities tomorrow.                            Written discharge instructions were provided to the                            patient. Maren Beach, MD 12/04/2023 9:41:42 AM This report has been signed electronically.

## 2023-12-04 NOTE — Progress Notes (Signed)
 Vss nad trans to pacu

## 2023-12-04 NOTE — Patient Instructions (Signed)

## 2023-12-05 ENCOUNTER — Telehealth: Payer: Self-pay | Admitting: *Deleted

## 2023-12-05 NOTE — Telephone Encounter (Signed)
 Attempted f/u phone call. No answer. Left message.

## 2023-12-08 ENCOUNTER — Encounter: Payer: Self-pay | Admitting: Pediatrics

## 2023-12-08 LAB — SURGICAL PATHOLOGY

## 2024-08-25 DIAGNOSIS — N390 Urinary tract infection, site not specified: Secondary | ICD-10-CM | POA: Diagnosis not present

## 2024-09-02 DIAGNOSIS — E785 Hyperlipidemia, unspecified: Secondary | ICD-10-CM | POA: Diagnosis not present
